# Patient Record
Sex: Male | Born: 1975 | Race: Black or African American | Hispanic: No | Smoking: Never smoker
Health system: Southern US, Community
[De-identification: ages and names within clinical notes are randomized; demographics above are authoritative.]

## PROBLEM LIST (undated history)

## (undated) DIAGNOSIS — I10 Essential (primary) hypertension: Secondary | ICD-10-CM

## (undated) HISTORY — DX: Essential (primary) hypertension: I10

---

## 2020-02-04 ENCOUNTER — Ambulatory Visit (HOSPITAL_COMMUNITY)
Admission: EM | Admit: 2020-02-04 | Discharge: 2020-02-04 | Disposition: A | Discharge status: Home / Self Care | Payer: Self-pay | Attending: Urgent Care | Admitting: Urgent Care

## 2020-02-04 ENCOUNTER — Encounter (HOSPITAL_COMMUNITY): Payer: Self-pay

## 2020-02-04 ENCOUNTER — Other Ambulatory Visit: Payer: Self-pay

## 2020-02-04 DIAGNOSIS — N50811 Right testicular pain: Secondary | ICD-10-CM

## 2020-02-04 DIAGNOSIS — I10 Essential (primary) hypertension: Secondary | ICD-10-CM

## 2020-02-04 DIAGNOSIS — N5089 Other specified disorders of the male genital organs: Secondary | ICD-10-CM

## 2020-02-04 DIAGNOSIS — N453 Epididymo-orchitis: Secondary | ICD-10-CM

## 2020-02-04 DIAGNOSIS — R030 Elevated blood-pressure reading, without diagnosis of hypertension: Secondary | ICD-10-CM

## 2020-02-04 LAB — BASIC METABOLIC PANEL
Anion gap: 12 (ref 5–15)
BUN: 10 mg/dL (ref 6–20)
CO2: 23 mmol/L (ref 22–32)
Calcium: 9.6 mg/dL (ref 8.9–10.3)
Chloride: 104 mmol/L (ref 98–111)
Creatinine, Ser: 1.31 mg/dL — ABNORMAL HIGH (ref 0.61–1.24)
GFR calc Af Amer: >60 mL/min (ref >60)
GFR calc non Af Amer: >60 mL/min (ref >60)
Glucose, Bld: 94 mg/dL (ref 70–99)
Potassium: 4.5 mmol/L (ref 3.5–5.1)
Sodium: 139 mmol/L (ref 135–145)

## 2020-02-04 LAB — POCT URINALYSIS DIPSTICK, ED / UC
Bilirubin Urine: NEGATIVE
Glucose, UA: NEGATIVE mg/dL
Hgb urine dipstick: NEGATIVE
Ketones, ur: NEGATIVE mg/dL
Nitrite: NEGATIVE
Protein, ur: 30 mg/dL — AB
Specific Gravity, Urine: 1.02 (ref 1.005–1.030)
Urobilinogen, UA: 1 mg/dL (ref 0.0–1.0)
pH: 8.5 — ABNORMAL HIGH (ref 5.0–8.0)

## 2020-02-04 MED ORDER — AZITHROMYCIN 250 MG PO TABS
ORAL_TABLET | ORAL | Status: AC
  Filled 2020-02-04: qty 4

## 2020-02-04 MED ORDER — CEFTRIAXONE SODIUM 1 G IJ SOLR
INTRAMUSCULAR | Status: AC
  Filled 2020-02-04: qty 10

## 2020-02-04 MED ORDER — AZITHROMYCIN 250 MG PO TABS
1000.0000 mg | ORAL_TABLET | Freq: Once | ORAL | Status: AC
  Administered 2020-02-04: 1000 mg via ORAL

## 2020-02-04 MED ORDER — LIDOCAINE HCL (PF) 1 % IJ SOLN
INTRAMUSCULAR | Status: AC
  Filled 2020-02-04: qty 2

## 2020-02-04 MED ORDER — CEFTRIAXONE SODIUM 1 G IJ SOLR
1.0000 g | Freq: Once | INTRAMUSCULAR | Status: AC
  Administered 2020-02-04: 1 g via INTRAMUSCULAR

## 2020-02-04 MED ORDER — LOSARTAN POTASSIUM 50 MG PO TABS: 50.0000 mg | ORAL_TABLET | Freq: Every day | ORAL | 0 refills | Status: AC

## 2020-02-04 MED ORDER — AMLODIPINE BESYLATE 5 MG PO TABS: 5.0000 mg | ORAL_TABLET | Freq: Every day | ORAL | 0 refills | Status: AC
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
## 2020-02-04 NOTE — Discharge Instructions (Addendum)
For the first week, start with amlodipine once daily. After that, going in to week 2 add losartan once daily. Take these two blood pressure medications together. Establish care with a new primary care provider through [facility].    For diabetes or elevated blood sugar, please make sure you are avoiding starchy, carbohydrate foods like pasta, breads, pastry, rice, potatoes, desserts. These foods can elevated your blood sugar. Also, avoid sodas, sweet teas, sugary beverages, fruit juices.  Drinking plain water will be much more helpful, try 64 ounces of water daily.  It is okay to flavor your water naturally by cutting cucumber, lemon, mint or lime, placing it in a picture with water and drinking it over a period of 2 to 3 days as long as it remains refrigerated.    For elevated blood pressure, make sure you are monitoring salt in your diet.  Do not eat restaurant foods and limit processed foods at home, prepare/cook your own foods at home.  Processed foods include things like frozen meals preseasoned meats and dinners, deli meats, canned foods as they are high in sodium/salt.  Make sure your pain attention to sodium labels on foods you by at the grocery store.  For seasoning you can use a brand called Mrs. Dash which includes a lot of salt free seasonings.  Salads - kale, spinach, cabbage, spring mix; use seeds like pumpkin seeds or sunflower seeds, almonds, walnuts or pecans; you can also use 1-2 hard boiled eggs in your salads Fruits - avocadoes, berries (blueberries, raspberries, blackberries), apples, oranges, pomegranate, pear; avoid eating bananas, grapes regularly Vegetables - aspargus, cauliflower, broccoli, green beans, brussel spouts, bell peppers; stay away from starchy vegetables like potatoes, carrots, peas  Regarding meat it is better to eat lean meats and limit your red meat including pork to once a week.  Wild caught fish, chicken breast are good options as they tend to be  leaner sources of good protein.   DO NOT EAT ANY FOODS ON THIS LIST THAT YOU ARE ALLERGIC TO.

## 2020-02-04 NOTE — ED Triage Notes (Addendum)
Pt present testicle swelling and pain. Symptom started two days ago. He is also C/O elevated BP. He has been with out medication since May.

## 2020-02-06 LAB — URINE CULTURE: Culture: NO GROWTH

## 2020-02-08 LAB — CYTOLOGY, (ORAL, ANAL, URETHRAL) ANCILLARY ONLY
Chlamydia: NEGATIVE
Comment: NEGATIVE
Comment: NEGATIVE
Comment: NORMAL
Neisseria Gonorrhea: NEGATIVE
Trichomonas: NEGATIVE

## 2020-09-11 ENCOUNTER — Other Ambulatory Visit: Payer: Self-pay

## 2020-09-11 ENCOUNTER — Emergency Department (HOSPITAL_COMMUNITY)
Admission: EM | Admit: 2020-09-11 | Discharge: 2020-09-11 | Disposition: A | Discharge status: Home / Self Care | Payer: Self-pay | Attending: Emergency Medicine | Admitting: Emergency Medicine

## 2020-09-11 ENCOUNTER — Emergency Department (HOSPITAL_COMMUNITY): Payer: Self-pay

## 2020-09-11 ENCOUNTER — Encounter (HOSPITAL_COMMUNITY): Payer: Self-pay | Admitting: Pharmacy Technician

## 2020-09-11 DIAGNOSIS — R42 Dizziness and giddiness: Secondary | ICD-10-CM | POA: Insufficient documentation

## 2020-09-11 DIAGNOSIS — Z5321 Procedure and treatment not carried out due to patient leaving prior to being seen by health care provider: Secondary | ICD-10-CM | POA: Insufficient documentation

## 2020-09-11 LAB — CBC WITH DIFFERENTIAL/PLATELET
Abs Immature Granulocytes: 0.04 10*3/uL (ref 0.00–0.07)
Basophils Absolute: 0.1 10*3/uL (ref 0.0–0.1)
Basophils Relative: 0 %
Eosinophils Absolute: 0.2 10*3/uL (ref 0.0–0.5)
Eosinophils Relative: 2 %
HCT: 46.3 % (ref 39.0–52.0)
Hemoglobin: 15.7 g/dL (ref 13.0–17.0)
Immature Granulocytes: 0 %
Lymphocytes Relative: 25 %
Lymphs Abs: 3 10*3/uL (ref 0.7–4.0)
MCH: 32.3 pg (ref 26.0–34.0)
MCHC: 33.9 g/dL (ref 30.0–36.0)
MCV: 95.3 fL (ref 80.0–100.0)
Monocytes Absolute: 0.6 10*3/uL (ref 0.1–1.0)
Monocytes Relative: 5 %
Neutro Abs: 7.8 10*3/uL — ABNORMAL HIGH (ref 1.7–7.7)
Neutrophils Relative %: 68 %
Platelets: 281 10*3/uL (ref 150–400)
RBC: 4.86 MIL/uL (ref 4.22–5.81)
RDW: 12.6 % (ref 11.5–15.5)
WBC: 11.7 10*3/uL — ABNORMAL HIGH (ref 4.0–10.5)
nRBC: 0 % (ref 0.0–0.2)

## 2020-09-11 LAB — COMPREHENSIVE METABOLIC PANEL
ALT: 28 U/L (ref 0–44)
AST: 27 U/L (ref 15–41)
Albumin: 4.1 g/dL (ref 3.5–5.0)
Alkaline Phosphatase: 90 U/L (ref 38–126)
Anion gap: 7 (ref 5–15)
BUN: 8 mg/dL (ref 6–20)
CO2: 25 mmol/L (ref 22–32)
Calcium: 9.7 mg/dL (ref 8.9–10.3)
Chloride: 104 mmol/L (ref 98–111)
Creatinine, Ser: 0.94 mg/dL (ref 0.61–1.24)
GFR, Estimated: >60 mL/min (ref >60)
Glucose, Bld: 108 mg/dL — ABNORMAL HIGH (ref 70–99)
Potassium: 3.7 mmol/L (ref 3.5–5.1)
Sodium: 136 mmol/L (ref 135–145)
Total Bilirubin: 0.6 mg/dL (ref 0.3–1.2)
Total Protein: 7.8 g/dL (ref 6.5–8.1)

## 2020-09-11 LAB — URINALYSIS, ROUTINE W REFLEX MICROSCOPIC
Bilirubin Urine: NEGATIVE
Glucose, UA: NEGATIVE mg/dL
Hgb urine dipstick: NEGATIVE
Ketones, ur: NEGATIVE mg/dL
Leukocytes,Ua: NEGATIVE
Nitrite: NEGATIVE
Protein, ur: NEGATIVE mg/dL
Specific Gravity, Urine: 1.043 — ABNORMAL HIGH (ref 1.005–1.030)
pH: 8 (ref 5.0–8.0)

## 2020-09-11 MED ORDER — IOHEXOL 350 MG/ML SOLN
75.0000 mL | Freq: Once | INTRAVENOUS | Status: AC | PRN
  Administered 2020-09-11: 75 mL via INTRAVENOUS

## 2020-09-11 MED ORDER — DIAZEPAM 5 MG PO TABS: 5.0000 mg | ORAL_TABLET | Freq: Once | ORAL | Status: DC

## 2020-09-11 NOTE — ED Notes (Addendum)
Pt left w/o being discharged. [place] PA aware, staff member in waiting room removed pt's IV.

## 2020-09-11 NOTE — ED Triage Notes (Signed)
Pt reports feeling like the room is spinning since yesterday. Pt states he is having trouble walking due to the dizziness.

## 2020-09-11 NOTE — ED Provider Notes (Signed)
Emergency Medicine Provider Triage Evaluation Note  Victor Howard 45 y.o. M  was evaluated in triage.  Pt complains of dizziness that began yesterday.  Patient reports that has been intermittently occurring and states he feels like it is getting worse.  He feels like today, he has been walking to his side.  Reports some intermittent blurry vision as well.  No fevers, chest pain, difficulty breathing, numbness/weakness of his arms or legs.  Review of Systems  Positive: Blurry vision, dizziness Negative: Chest pain, difficulty breathing, fever, numbness/weakness of his arms or legs.  Physical Exam  BP 134/82   Pulse 70   Temp 98.2 F (36.8 C) (Oral)   Resp 18   Ht 5\' 4"  (1.626 m)   Wt 65.8 kg   SpO2 100%   BMI 24.89 kg/m  Gen:   Awake, no distress  HEENT:  Atraumatic.  Horizontal nystagmus noted to the left. Resp:  Normal effort  Cardiac:  Normal rate  Abd:   Nondistended, nontender  MSK:   Moves extremities without difficulty. 5/5 BUE and BLE. CN III-XII intact.  Neuro:  Speech clear  Medical Decision Making  Medically screening exam initiated at 3:55 AM.  Appropriate orders placed.  Victor Howard was informed that the remainder of the evaluation will be completed by another provider, this initial triage assessment does not replace that evaluation, and the importance of remaining in the ED until their evaluation is complete.   Clinical Impression  Dizziness    Portions of this note were generated with Dragon dictation software. Dictation errors may occur despite best attempts at proofreading.     , PA-C 09/11/20 1300    11/11/20, MD 09/12/20 1013

## 2020-09-11 NOTE — Progress Notes (Signed)
Pt stated he is claustrophobic. Attempted to proceed with scan but once he was inside the scanner he said he could not do it. Asked if he would be able to do the study with meds but he said, "no meds."

## 2020-09-11 NOTE — ED Notes (Signed)
Pts family member stated that pt was in the car and his IV needed to be removed. This NT notified RN that pt had left and removed IV.

## 2020-09-11 NOTE — ED Notes (Signed)
Pt ambulated to bathroom w/ assistance from wife, pt reports feeling dizzy but denies lightheadedness
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
## 2020-09-11 NOTE — ED Notes (Signed)
Patient transported to CT 

## 2022-01-24 ENCOUNTER — Emergency Department
Admission: EM | Admit: 2022-01-24 | Discharge: 2022-01-24 | Disposition: A | Discharge status: Home / Self Care | Payer: Self-pay | Attending: Emergency Medicine | Admitting: Emergency Medicine

## 2022-01-24 ENCOUNTER — Encounter: Payer: Self-pay | Admitting: Emergency Medicine

## 2022-01-24 ENCOUNTER — Other Ambulatory Visit: Payer: Self-pay

## 2022-01-24 ENCOUNTER — Emergency Department: Payer: Self-pay

## 2022-01-24 DIAGNOSIS — S1191XA Laceration without foreign body of unspecified part of neck, initial encounter: Secondary | ICD-10-CM | POA: Diagnosis not present

## 2022-01-24 DIAGNOSIS — S0101XA Laceration without foreign body of scalp, initial encounter: Secondary | ICD-10-CM | POA: Diagnosis not present

## 2022-01-24 DIAGNOSIS — S0990XA Unspecified injury of head, initial encounter: Secondary | ICD-10-CM | POA: Diagnosis present

## 2022-01-24 MED ORDER — BACITRACIN ZINC 500 UNIT/GM EX OINT
TOPICAL_OINTMENT | Freq: Once | CUTANEOUS | Status: AC
  Filled 2022-01-24: qty 0.9

## 2022-01-24 MED ORDER — LIDOCAINE-EPINEPHRINE 2 %-1:100000 IJ SOLN
20.0000 mL | Freq: Once | INTRAMUSCULAR | Status: DC
  Filled 2022-01-24: qty 1

## 2022-01-24 NOTE — ED Notes (Signed)
Patient is agreeable to receiving a CT scan and wound care, but is refusing blood work and an EKG at this time. Dr. Katrinka Blazing notified.

## 2022-01-24 NOTE — ED Notes (Signed)
ED Provider at bedside. 

## 2022-01-24 NOTE — Discharge Instructions (Addendum)
Gently wash the wound with soap and water.  It is okay to shower, but do not submerge in a bath or go swimming as it is healing.  Do not vigorously scrub.   Gently pat dry.   Once dry, then apply Neosporin or bacitracin or even Vaseline ointment to the area to act as a barrier to help prevent infection.  Replaced a total of 22 stitches, 10 on the cut on your neck and 12 in your scalp.  All of these will absorb on their own do and do not need to be taken out.  As long as no infection develops there should be nothing to do.
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
## 2023-03-11 IMAGING — CT CT ANGIO HEAD
3 of 10 series · 15 of 47 positions shown · IV contrast (APPLIED)
Comparison: Head CT earlier same day.

CLINICAL DATA: Vertigo.  Dizziness.

EXAM:
CT ANGIOGRAPHY HEAD AND NECK
TECHNIQUE: Multidetector CT imaging of the head and neck was performed using
the standard protocol during bolus administration of intravenous
contrast. Multiplanar CT image reconstructions and MIPs were
obtained to evaluate the vascular anatomy. Carotid stenosis
measurements (when applicable) are obtained utilizing NASCET
criteria, using the distal internal carotid diameter as the
denominator.
CONTRAST:  75mL OMNIPAQUE IOHEXOL 350 MG/ML SOLN

[Series 6: thin axial · axial · 0.49mm/px · z∈[-272,+6]mm · 9 of 348 slices shown]
[im 35/348  brain]
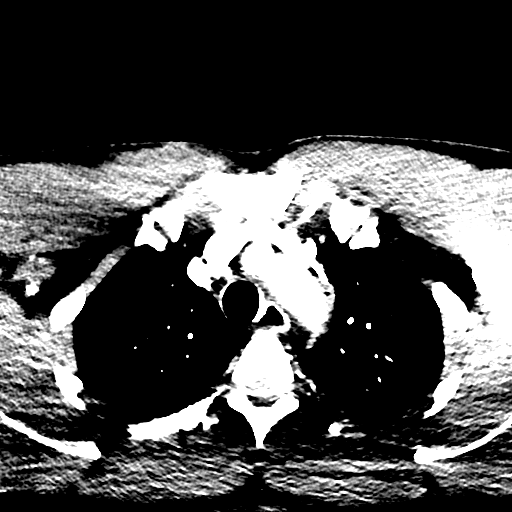
[im 70/348  bone]
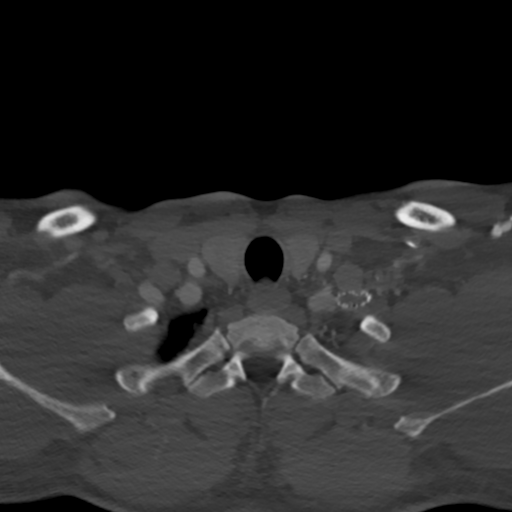
[im 105/348  brain]
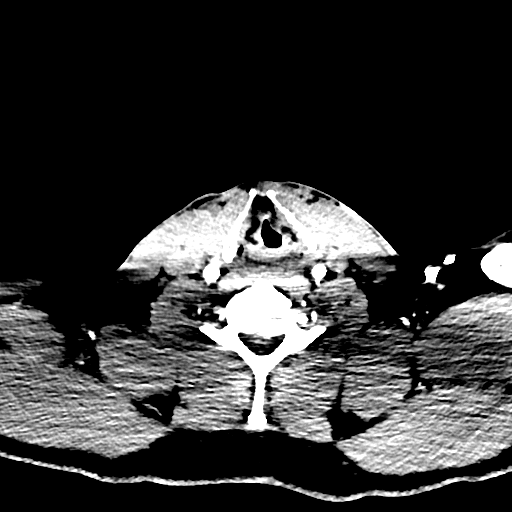
[im 139/348  bone]
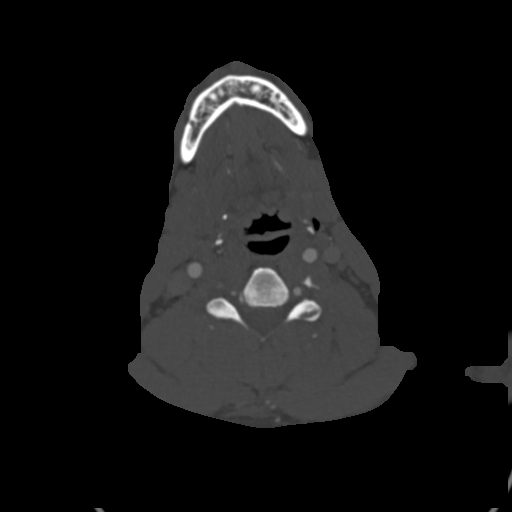
[im 174/348  brain]
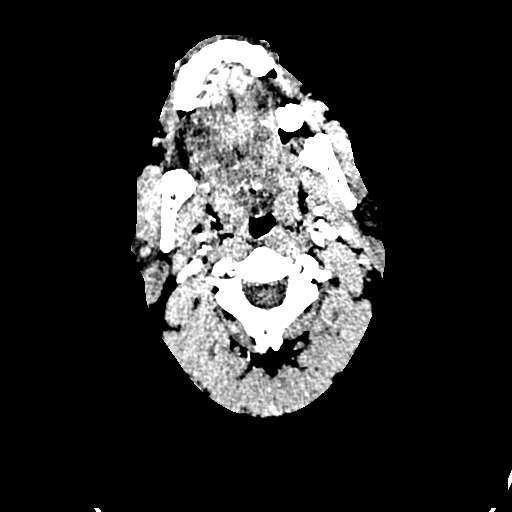
[im 209/348  bone]
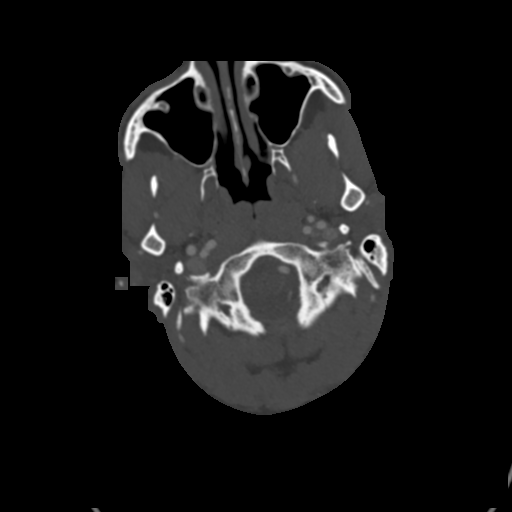
[im 243/348  brain]
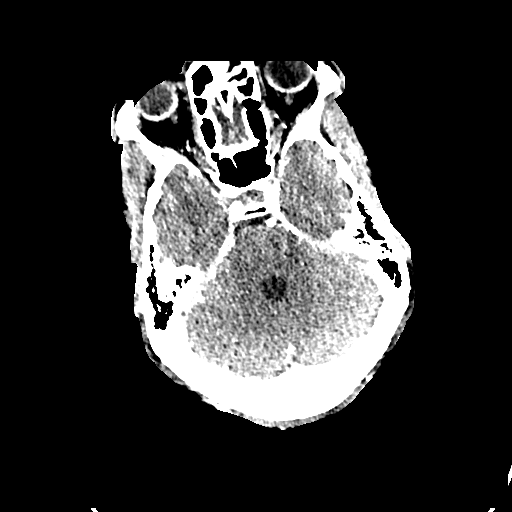
[im 278/348  bone]
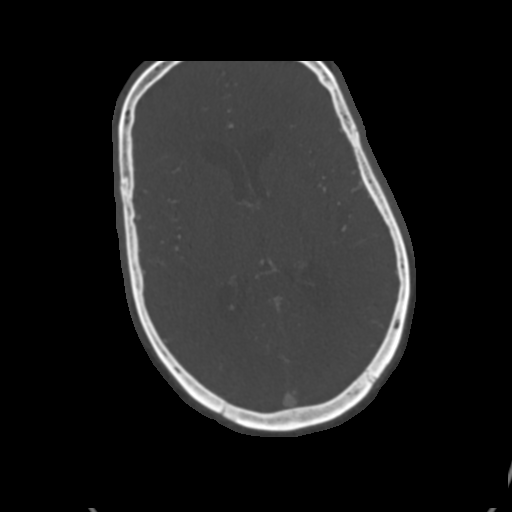
[im 313/348  brain]
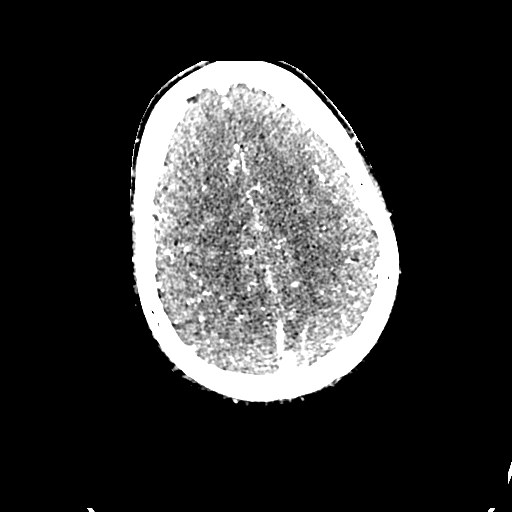

[Series 7: coronal thin · coronal · 0.59mm/px · 3 of 298 slices shown]
[im 85/298  brain]
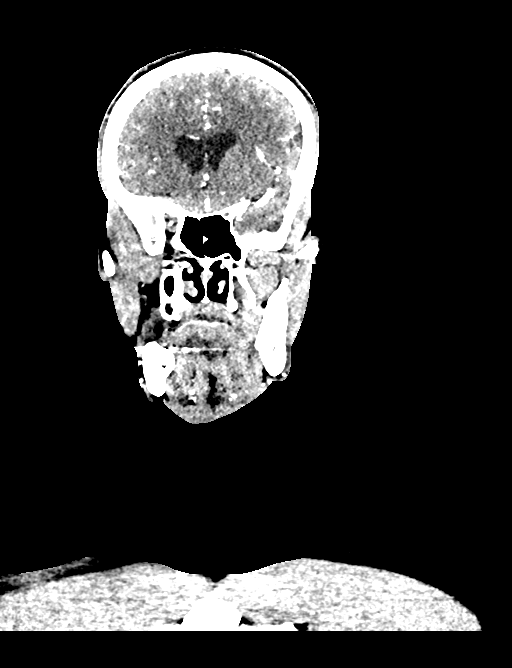
[im 128/298  brain]
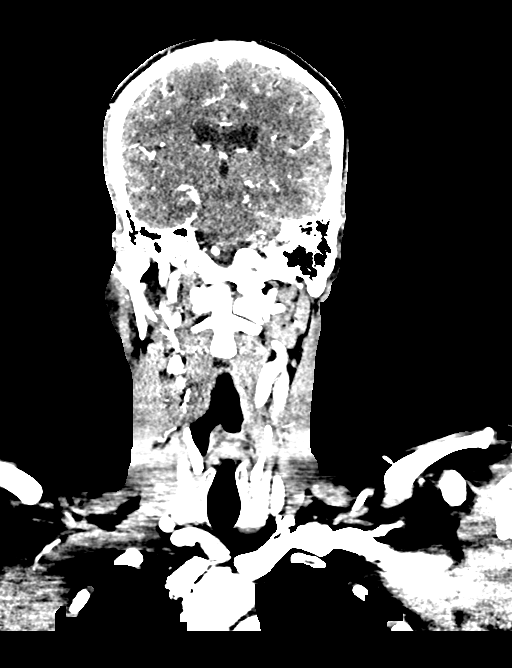
[im 170/298  brain]
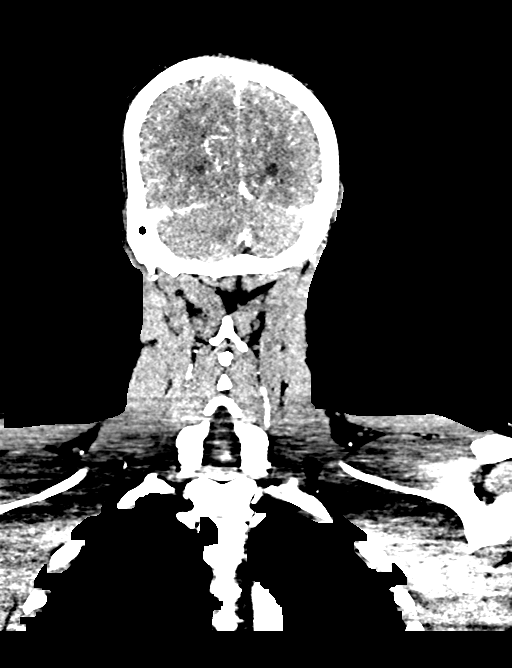

[Series 9: sagittal thin · sagittal · 0.53mm/px · 3 of 301 slices shown]
[im 61/301  brain]
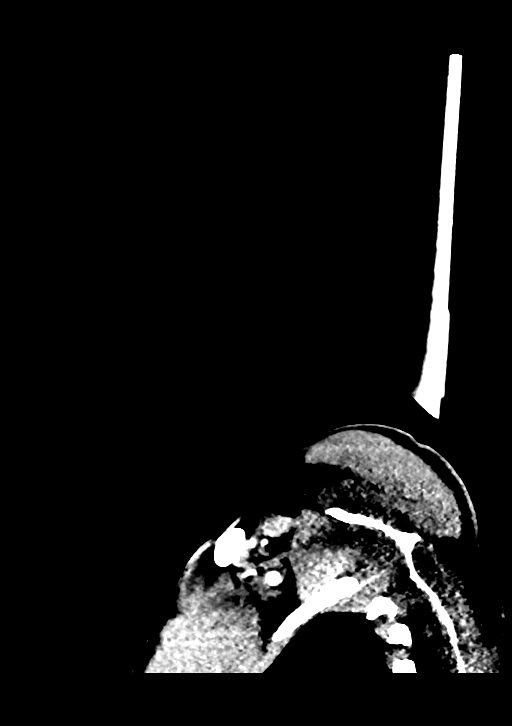
[im 121/301  brain]
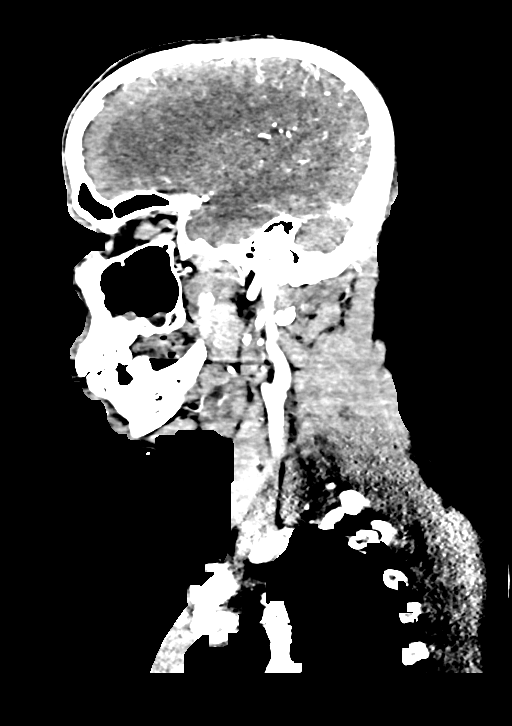
[im 181/301  brain]
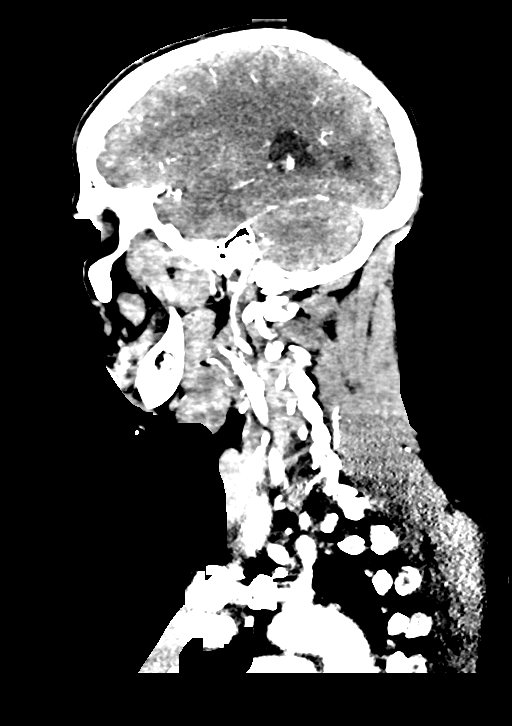

[15 of 47 positions shown; findings below may reference images not displayed]

FINDINGS: CTA NECK FINDINGS

Aortic arch: Normal. No atherosclerotic change. Branching pattern is
normal.

Right carotid system: Common carotid artery widely patent to the
bifurcation. Carotid bifurcation is normal. Cervical ICA is normal.

Left carotid system: Common carotid artery widely patent to the
bifurcation. Bifurcation is normal. Cervical ICA is normal.

Vertebral arteries: Both vertebral artery origins are widely patent.
Right vertebral artery is a small vessel but is widely patent
through the cervical region to the foramen magnum. Left vertebral
artery is dominant and is likewise normal.

There is a left subclavian artery stent which appears to be
occluded. Detail is limited by dense venous contrast adjacent to
that.

Skeleton: Ordinary cervical spondylosis.

Other neck: No mass or adenopathy.

Upper chest: Normal

Review of the MIP images confirms the above findings

CTA HEAD FINDINGS

Anterior circulation: Both internal carotid arteries are patent
through the skull base and siphon regions. The anterior and middle
cerebral vessels are normal. No large or medium vessel occlusion,
stenosis, aneurysm or vascular malformation.

Posterior circulation: Small right vertebral artery does not show
flow beyond the foramen magnum. Dominant left vertebral artery
widely patent to the basilar. No basilar stenosis. Flow is seen in
left PICA, both anterior inferior cerebellar arteries, both superior
cerebellar arteries and both posterior cerebral arteries. No flow
seen in right PICA.

Venous sinuses: Patent and normal.

Anatomic variants: None significant.

Review of the MIP images confirms the above findings
IMPRESSION: Normal aorta. Normal carotid arteries. No intracranial anterior
circulation abnormality.

Small right vertebral artery does not show flow beyond the foramen
magnum. Dominant left vertebral artery widely patent to the basilar.
Posterior circulation branch vessels show flow, with the exception
of right PICA, which is not seen.

Left subclavian artery stent distal to the left vertebral artery
origin which appears to be occluded. This is not well seen because
of adjacent dense venous contrast.
